# Patient Record
Sex: Female | Born: 1985 | Race: White | Hispanic: No | Marital: Single | Smoking: Current some day smoker
Health system: Southern US, Community
[De-identification: ages and names within clinical notes are randomized; demographics above are authoritative.]

## PROBLEM LIST (undated history)

## (undated) DIAGNOSIS — F1011 Alcohol abuse, in remission: Secondary | ICD-10-CM

## (undated) DIAGNOSIS — F988 Other specified behavioral and emotional disorders with onset usually occurring in childhood and adolescence: Secondary | ICD-10-CM

## (undated) DIAGNOSIS — F419 Anxiety disorder, unspecified: Secondary | ICD-10-CM

## (undated) DIAGNOSIS — F329 Major depressive disorder, single episode, unspecified: Secondary | ICD-10-CM

## (undated) DIAGNOSIS — F32A Depression, unspecified: Secondary | ICD-10-CM

---

## 2017-05-22 ENCOUNTER — Encounter: Payer: Self-pay | Admitting: Gynecology

## 2017-05-22 ENCOUNTER — Other Ambulatory Visit: Payer: Self-pay

## 2017-05-22 ENCOUNTER — Ambulatory Visit
Admission: EM | Admit: 2017-05-22 | Discharge: 2017-05-22 | Disposition: A | Discharge status: Home / Self Care | Payer: Self-pay | Attending: Family Medicine | Admitting: Family Medicine

## 2017-05-22 DIAGNOSIS — N898 Other specified noninflammatory disorders of vagina: Secondary | ICD-10-CM

## 2017-05-22 DIAGNOSIS — B9689 Other specified bacterial agents as the cause of diseases classified elsewhere: Secondary | ICD-10-CM

## 2017-05-22 DIAGNOSIS — J340 Abscess, furuncle and carbuncle of nose: Secondary | ICD-10-CM

## 2017-05-22 DIAGNOSIS — N760 Acute vaginitis: Secondary | ICD-10-CM

## 2017-05-22 HISTORY — DX: Alcohol abuse, in remission: F10.11

## 2017-05-22 HISTORY — DX: Other specified behavioral and emotional disorders with onset usually occurring in childhood and adolescence: F98.8

## 2017-05-22 HISTORY — DX: Anxiety disorder, unspecified: F41.9

## 2017-05-22 HISTORY — DX: Major depressive disorder, single episode, unspecified: F32.9

## 2017-05-22 HISTORY — DX: Depression, unspecified: F32.A

## 2017-05-22 LAB — WET PREP, GENITAL
Clue Cells Wet Prep HPF POC: NONE SEEN
Sperm: NONE SEEN
Trich, Wet Prep: NONE SEEN
WBC, Wet Prep HPF POC: NONE SEEN
Yeast Wet Prep HPF POC: NONE SEEN

## 2017-05-22 MED ORDER — METRONIDAZOLE 500 MG PO TABS: 500.0000 mg | ORAL_TABLET | Freq: Two times a day (BID) | ORAL | 0 refills | Status: DC

## 2017-05-22 MED ORDER — MUPIROCIN 2 % EX OINT: 1.0000 "application " | TOPICAL_OINTMENT | Freq: Three times a day (TID) | CUTANEOUS | 0 refills | Status: DC

## 2017-05-22 NOTE — ED Provider Notes (Signed)
[facility]    CSN: [id number] Arrival date & time: 05/22/17  [id number]     History   Chief Complaint Chief Complaint  Patient presents with  . Vaginitis    HPI Tara Pennington is a 31 y.o. female.   HPI  This is a 31 year old female presents with a 2 weeks history of a vaginal infection.  She is having a yellowish discharge has a fishy smell.  States that she has a new female partner suspects she may be the source.  In addition she has had a couple of previous infections requiring Flagyl has helped.  She has tried over-the-counter Monistat for 4 days but has had no success this episode.  She had no fever or chills.  States that she has no external irritation.         Past Medical History:  Diagnosis Date  . ADD (attention deficit disorder)   . Anxiety and depression   . History of alcohol abuse     There are no active problems to display for this patient.   History reviewed. No pertinent surgical history.  OB History    No data available       Home Medications    Prior to Admission medications   Medication Sig Start Date End Date Taking? Authorizing Provider  citalopram (CELEXA) 40 MG tablet Take 40 mg by mouth daily.   Yes [provider]  metroNIDAZOLE (FLAGYL) 500 MG tablet Take 1 tablet (500 mg total) by mouth 2 (two) times daily. 05/22/17   Lutricia Feiloemer, Fardeen Steinberger P, PA-C  mupirocin ointment (BACTROBAN) 2 % Apply 1 application topically 3 (three) times daily. 05/22/17   Lutricia Feiloemer, Joden Bonsall P, PA-C    Family History Family History  Problem Relation Age of Onset  . Heart disease Father     Social History Social History   Tobacco Use  . Smoking status: Current Some Day Smoker    Packs/day: 0.50    Types: Cigarettes  . Smokeless tobacco: Never Used  Substance Use Topics  . Alcohol use: Yes  . Drug use: No     Allergies   Patient has no known allergies.   Review of Systems Review of Systems  Constitutional: Positive for activity  change. Negative for chills, fatigue and fever.  Genitourinary: Positive for vaginal discharge. Negative for dysuria, frequency, pelvic pain, vaginal bleeding and vaginal pain.  All other systems reviewed and are negative.    Physical Exam Triage Vital Signs ED Triage Vitals [05/22/17 0916]  Enc Vitals Group     BP 109/66     Pulse Rate 60     Resp 16     Temp 98.2 F (36.8 C)     Temp Source Oral     SpO2 100 %     Weight 145 lb (65.8 kg)     Height 5\' 1"  (1.549 m)     Head Circumference      Peak Flow      Pain Score 2     Pain Loc      Pain Edu?      Excl. in [place]?    No data found.  Updated Vital Signs BP 109/66 (BP Location: Left Arm)   Pulse 60   Temp 98.2 F (36.8 C) (Oral)   Resp 16   Ht 5\' 1"  (1.549 m)   Wt 145 lb (65.8 kg)   LMP 05/21/2017   SpO2 100%   BMI 27.40 kg/m   Visual Acuity Right Eye Distance:  Left Eye Distance:   Bilateral Distance:    Right Eye Near:   Left Eye Near:    Bilateral Near:     Physical Exam  Constitutional: She is oriented to person, place, and time. She appears well-developed and well-nourished. No distress.  HENT:  Head: Normocephalic.  Patient had a large cold sore on the left sidewall of her nose and extends into the mucous membrane.  And now has a secondary infection.  Eyes: Pupils are equal, round, and reactive to light.  Neck: Normal range of motion.  Genitourinary:  Genitourinary Comments: Patient obtained a self swab.  No physical examination was performed  Musculoskeletal: Normal range of motion.  Neurological: She is alert and oriented to person, place, and time.  Skin: Skin is warm and dry. She is not diaphoretic.  Psychiatric: She has a normal mood and affect. Her behavior is normal. Judgment and thought content normal.  Nursing note and vitals reviewed.    UC Treatments / Results  Labs (all labs ordered are listed, but only abnormal results are displayed) Labs Reviewed  WET PREP, GENITAL     EKG  EKG Interpretation None       Radiology No results found.  Procedures Procedures (including critical care time)  Medications Ordered in UC Medications - No data to display   Initial Impression / Assessment and Plan / UC Course  I have reviewed the triage vital signs and the nursing notes.  Pertinent labs & imaging results that were available during my care of the patient were reviewed by me and considered in my medical decision making (see chart for details).     Plan: 1. Test/x-ray results and diagnosis reviewed with patient 2. rx as per orders; risks, benefits, potential side effects reviewed with patient 3. Recommend supportive treatment with use of Bactroban ointment to the cellulitis on the nose.  Place ointment 3 times daily.  No vaginal abnormal cells were seen on the self obtained specimen.  Because her symptoms and recurrence of her previous BV I will treat her empirically at this time.  I have told her that if she does not improve she will need to have further evaluation.  She has established with a primary care in [place] and I will direct her there if she continues to have symptoms. 4. F/u prn if symptoms worsen or don't improve   Final Clinical Impressions(s) / UC Diagnoses   Final diagnoses:  BV (bacterial vaginosis)  Cellulitis of sidewall of nose    ED Discharge Orders        Ordered    metroNIDAZOLE (FLAGYL) 500 MG tablet  2 times daily     05/22/17 1030    mupirocin ointment (BACTROBAN) 2 %  3 times daily     05/22/17 1030       Controlled Substance Prescriptions [place] Controlled Substance Registry consulted? Not Applicable   Lutricia FeilRoemer, Jancarlo Biermann P, PA-C 05/22/17 1037

## 2017-05-22 NOTE — ED Triage Notes (Signed)
Per patient vaginal infection x 2 weeks. Pt. C/o yellowish discharge.

## 2018-06-14 ENCOUNTER — Ambulatory Visit: Payer: BLUE CROSS/BLUE SHIELD | Admitting: Surgery

## 2020-12-16 ENCOUNTER — Ambulatory Visit (INDEPENDENT_AMBULATORY_CARE_PROVIDER_SITE_OTHER): Payer: BC Managed Care – PPO

## 2020-12-16 ENCOUNTER — Encounter: Payer: Self-pay | Admitting: Emergency Medicine

## 2020-12-16 ENCOUNTER — Ambulatory Visit: Payer: BC Managed Care – PPO

## 2020-12-16 ENCOUNTER — Other Ambulatory Visit: Payer: Self-pay

## 2020-12-16 ENCOUNTER — Ambulatory Visit
Admission: EM | Admit: 2020-12-16 | Discharge: 2020-12-16 | Disposition: A | Discharge status: Home / Self Care | Payer: BC Managed Care – PPO | Attending: Internal Medicine | Admitting: Internal Medicine

## 2020-12-16 DIAGNOSIS — U071 COVID-19: Secondary | ICD-10-CM | POA: Diagnosis not present

## 2020-12-16 DIAGNOSIS — G8929 Other chronic pain: Secondary | ICD-10-CM | POA: Insufficient documentation

## 2020-12-16 DIAGNOSIS — F17200 Nicotine dependence, unspecified, uncomplicated: Secondary | ICD-10-CM | POA: Diagnosis present

## 2020-12-16 DIAGNOSIS — R079 Chest pain, unspecified: Secondary | ICD-10-CM

## 2020-12-16 DIAGNOSIS — R059 Cough, unspecified: Secondary | ICD-10-CM | POA: Diagnosis not present

## 2020-12-16 DIAGNOSIS — M25552 Pain in left hip: Secondary | ICD-10-CM | POA: Diagnosis not present

## 2020-12-16 DIAGNOSIS — M25512 Pain in left shoulder: Secondary | ICD-10-CM | POA: Diagnosis not present

## 2020-12-16 LAB — SARS CORONAVIRUS 2 (TAT 6-24 HRS): SARS Coronavirus 2: POSITIVE — AB

## 2020-12-16 NOTE — ED Provider Notes (Signed)
[facility]    CSN: [id number] Arrival date & time: 12/16/20  1136      History   Chief Complaint Chief Complaint  Patient presents with   Sore Throat   Cough   Nasal Congestion   Chest Pain    Left side     HPI Tara Pennington is a 35 y.o. female who presents with onset of ST, mild non productive cough, body aches x 2 days. Denies fever or feeling bad, just feels she has a cold.  She tested herself for Covid yesterday and was positive and her employer needs her to be tested at a clinic. She also has been having L chest pain described as " sore" x 4 months off and on which is a little more painful and constant for the past 2 days. Denies sweats, but admits she is a smoker.     Past Medical History:  Diagnosis Date   ADD (attention deficit disorder)    Anxiety and depression    History of alcohol abuse     There are no problems to display for this patient.   History reviewed. No pertinent surgical history.  OB History   No obstetric history on file.      Home Medications    Prior to Admission medications   Medication Sig Start Date End Date Taking? Authorizing Provider  citalopram (CELEXA) 40 MG tablet Take 40 mg by mouth daily.    [provider]    Family History Family History  Problem Relation Age of Onset   Heart disease Father     Social History Social History   Tobacco Use   Smoking status: Some Days    Packs/day: 0.50    Types: Cigarettes   Smokeless tobacco: Never  Substance Use Topics   Alcohol use: Yes   Drug use: No     Allergies   Patient has no known allergies.   Review of Systems Review of Systems  Constitutional:  Negative for activity change, appetite change, chills, diaphoresis, fatigue and fever.  HENT:  Positive for congestion, postnasal drip, rhinorrhea and sore throat. Negative for trouble swallowing.   Eyes:  Negative for discharge.  Respiratory:  Positive for cough. Negative for chest tightness  and shortness of breath.   Cardiovascular:  Positive for chest pain.  Gastrointestinal:  Negative for diarrhea, nausea and vomiting.  Musculoskeletal:  Positive for myalgias.       L shoulder and L hip-pelvic pain x 1 year since a few days after she had the 2nd covid shot.   Skin:  Negative for rash.  Neurological:  Positive for headaches.  Hematological:  Negative for adenopathy.    Physical Exam Triage Vital Signs ED Triage Vitals  Enc Vitals Group     BP 12/16/20 1202 (!) 134/99     Pulse Rate 12/16/20 1202 79     Resp 12/16/20 1202 18     Temp 12/16/20 1202 98.4 F (36.9 C)     Temp Source 12/16/20 1202 Oral     SpO2 12/16/20 1202 100 %     Weight --      Height --      Head Circumference --      Peak Flow --      Pain Score 12/16/20 1159 7     Pain Loc --      Pain Edu? --      Excl. in [place]? --    No data found.  Updated Vital Signs  BP (!) 134/99   Pulse 79   Temp 98.4 F (36.9 C) (Oral)   Resp 18   LMP 11/25/2020 Comment: denies preg. signed waiver  SpO2 100%   Visual Acuity Right Eye Distance:   Left Eye Distance:   Bilateral Distance:    Right Eye Near:   Left Eye Near:    Bilateral Near:      Physical Exam Vitals signs and nursing note reviewed.  Constitutional:      General: She is not in acute distress.    Appearance: Normal appearance. She is not ill-appearing, toxic-appearing or diaphoretic.  HENT:     Head: Normocephalic.     Right Ear: Tympanic membrane, ear canal and external ear normal.     Left Ear: Tympanic membrane, ear canal and external ear normal.     Nose: Nose normal.     Mouth/Throat:     Mouth: Mucous membranes are moist.  Eyes:     General: No scleral icterus.       Right eye: No discharge.        Left eye: No discharge.     Conjunctiva/sclera: Conjunctivae normal.  Neck:     Musculoskeletal: Neck supple. No neck rigidity.  Cardiovascular:     Rate and Rhythm: Normal rate and regular rhythm.     Heart sounds: No  murmur.  Pulmonary:     Effort: Pulmonary effort is normal.     Breath sounds: Normal breath sounds. Does not have chest wall tenderness   Musculoskeletal: Normal range of motion if L shoulder and L up with no pain.   Lymphadenopathy:     Cervical: No cervical adenopathy.  Skin:    General: Skin is warm and dry.     Coloration: Skin is not jaundiced.     Findings: No rash.  Neurological:     Mental Status: She is alert and oriented to person, place, and time. Lower leg and L arm reflexes are normal.     Gait: Gait normal.  Psychiatric:        Mood and Affect: Mood normal.        Behavior: Behavior normal.        Thought Content: Thought content normal.        Judgment: Judgment normal.    UC Treatments / Results  Labs (all labs ordered are listed, but only abnormal results are displayed) Labs Reviewed  SARS CORONAVIRUS 2 (TAT 6-24 HRS)    EKG   Radiology DG Chest 2 View  Result Date: 12/16/2020 CLINICAL DATA:  Left-sided chest pain for 4 months. Cough. COVID-19 positive. EXAM: CHEST - 2 VIEW COMPARISON:  None. FINDINGS: The cardiomediastinal silhouette is within normal limits. The lungs are well inflated and clear. There is no evidence of pleural effusion or pneumothorax. No acute osseous abnormality is identified. IMPRESSION: No active cardiopulmonary disease. Electronically Signed   By: Sebastian Ache M.D.   On: 12/16/2020 13:14   DG Shoulder Left  Result Date: 12/16/2020 CLINICAL DATA:  Anterior left shoulder pain for 1 year. EXAM: LEFT SHOULDER - 2+ VIEW COMPARISON:  None. FINDINGS: There is no evidence of fracture or dislocation. There is no evidence of arthropathy or other focal bone abnormality. Soft tissues are unremarkable. IMPRESSION: Negative. Electronically Signed   By: Sebastian Ache M.D.   On: 12/16/2020 13:14   DG Hip Unilat With Pelvis 2-3 Views Left  Result Date: 12/16/2020 CLINICAL DATA:  Left hip pain. EXAM: DG HIP (WITH OR WITHOUT PELVIS)  2-3V LEFT  COMPARISON:  None. FINDINGS: No acute fracture or hip dislocation is identified. Left hip joint space width is preserved. A 2 mm well corticated ossicle at the superolateral aspect of the left acetabulum may be developmental or related to remote trauma. IMPRESSION: No acute osseous abnormality identified. Electronically Signed   By: Sebastian Ache M.D.   On: 12/16/2020 13:16    Procedures Procedures (including critical care time)  Medications Ordered in UC Medications - No data to display  Initial Impression / Assessment and Plan / UC Course  I have reviewed the triage vital signs and the nursing notes. Pertinent  imaging results that were available during my care of the patient were reviewed by me and considered in my medical decision making (see chart for details). Covid test is pending. Has viral URI, covid suspect. Chronic L chest pain, L shoulder pain and L hip-pelvic pain. Advised to FU with PCP with chronic pains.  She declined antiviral rx.  See instructions    Final Clinical Impressions(s) / UC Diagnoses   Final diagnoses:  COVID-19 virus infection  Chronic chest pain  Chronic left shoulder pain  Chronic hip pain, left  Smoker     Discharge Instructions      Your xrays look normal to me, but if the radiologist find something I did not see I will call you. Work on quit smoking. Please follow up with your primary care doctor about your chest  pain and L shoulder and hip pains You may take any cold medications to help your symptoms. You may take the following supplements to help support your immune system fight Covid infection since you are not interested in taking the experimental antiviral medication.  Take Quarcetin 500 mg three times a day x 7 days with Zinc 50 mg ones a day x 7 days. The quarcetin is an antiviral and anti-inflammatory supplement which helps open the zinc channels in the cell to absorb Zinc. Zinc helps decrease the virus load in your body. Take Melatonin  6-10 mg at bed time which also helps support your immune system.  Also make sure to take Vit D 5,000 IU per day with a fatty meal and Vit C 5000 mg a day until you are completely better. To prevent viral illnesses your vitamin D should be between 60-80. Stay on Vitamin D 2,000  and C  1000 mg the rest of the season.  Don't lay around, keep active and walk as much as you are able to to prevent worsening of your symptoms.  Follow up with your family Dr next week.  If you get short of breath and you are able to check  your oxygen with a pulse oxygen meter, if it gets to 92% or less, you need to go to the hospital to be admitted. If you dont have one, come back here and we will assess you.       ED Prescriptions   None    PDMP not reviewed this encounter.   Garey Ham, PA-C 12/16/20 1323

## 2020-12-16 NOTE — ED Triage Notes (Signed)
Pt is present today with sore throat, cough, sore throat, chest pain, and HA. Pt states that her sx started Saturday. Pt states that she tested positive yesterday for covid using the at home covid test. Pt states that she has had chest pain for over four months

## 2020-12-16 NOTE — Discharge Instructions (Addendum)
Your xrays look normal to me, but if the radiologist find something I did not see I will call you. Work on quit smoking. Please follow up with your primary care doctor about your chest  pain and L shoulder and hip pains You may take any cold medications to help your symptoms. You may take the following supplements to help support your immune system fight Covid infection since you are not interested in taking the experimental antiviral medication.  Take Quarcetin 500 mg three times a day x 7 days with Zinc 50 mg ones a day x 7 days. The quarcetin is an antiviral and anti-inflammatory supplement which helps open the zinc channels in the cell to absorb Zinc. Zinc helps decrease the virus load in your body. Take Melatonin 6-10 mg at bed time which also helps support your immune system.  Also make sure to take Vit D 5,000 IU per day with a fatty meal and Vit C 5000 mg a day until you are completely better. To prevent viral illnesses your vitamin D should be between 60-80. Stay on Vitamin D 2,000  and C  1000 mg the rest of the season.  Don't lay around, keep active and walk as much as you are able to to prevent worsening of your symptoms.  Follow up with your family Dr next week.  If you get short of breath and you are able to check  your oxygen with a pulse oxygen meter, if it gets to 92% or less, you need to go to the hospital to be admitted. If you dont have one, come back here and we will assess you.

## 2022-03-21 ENCOUNTER — Ambulatory Visit
Admission: EM | Admit: 2022-03-21 | Discharge: 2022-03-21 | Disposition: A | Discharge status: Home / Self Care | Payer: BC Managed Care – PPO

## 2022-03-21 DIAGNOSIS — S0992XA Unspecified injury of nose, initial encounter: Secondary | ICD-10-CM | POA: Diagnosis not present

## 2022-03-21 NOTE — Discharge Instructions (Addendum)
As we discussed there is nothing to do acutely for a broken nose.  Protect nose from further injury.  You can apply ice to the bridge of your nose for torments at a time 2-3 times a day to help with pain and swelling.  Avoid blowing your nose as you do not want to start bleeding again.  Use over-the-counter Tylenol or ibuprofen as needed for pain and swelling.  If once the swelling goes down your nose is misshapen you can consult an ear nose and throat physician to discuss having surgery to correct its appearance.

## 2022-03-21 NOTE — ED Triage Notes (Signed)
Pt states she fell off her porch last night, injured nose

## 2022-03-21 NOTE — ED Provider Notes (Signed)
[facility]    CSN: [id number] Arrival date & time: 03/21/22  1345      History   Chief Complaint Chief Complaint  Patient presents with   Fall   Facial Injury    HPI Tara Pennington is a 36 y.o. female.   HPI  36 year old female here for evaluation of nasal injury.  Patient reports that she fell off her porch last night and struck her nose on a set of concrete steps.  She states that it bled a lot but she was able to get it to stop.  She is not having any active bleeding now.  She denies any difficulty breathing through her nose.  She did not have a loss of consciousness.  Patient does have swelling and tenderness to the bridge of her nose.  Past Medical History:  Diagnosis Date   ADD (attention deficit disorder)    Anxiety and depression    History of alcohol abuse     There are no problems to display for this patient.   History reviewed. No pertinent surgical history.  OB History   No obstetric history on file.      Home Medications    Prior to Admission medications   Medication Sig Start Date End Date Taking? Authorizing Provider  citalopram (CELEXA) 40 MG tablet Take 40 mg by mouth daily.    [provider]  disulfiram (ANTABUSE) 250 MG tablet Take 250 mg by mouth daily. 02/24/22   [provider]    Family History Family History  Problem Relation Age of Onset   Heart disease Father     Social History Social History   Tobacco Use   Smoking status: Some Days    Packs/day: 0.50    Types: Cigarettes   Smokeless tobacco: Never  Substance Use Topics   Alcohol use: Yes   Drug use: No     Allergies   Patient has no known allergies.   Review of Systems Review of Systems  HENT:  Positive for facial swelling and nosebleeds.        Swelling to the bridge of her nose and had a nosebleed last night after suffering a fall.  No active bleeding now.  Neurological:  Negative for syncope.     Physical Exam Triage Vital  Signs ED Triage Vitals  Enc Vitals Group     BP 03/21/22 1354 110/69     Pulse Rate 03/21/22 1354 96     Resp --      Temp 03/21/22 1354 98.4 F (36.9 C)     Temp Source 03/21/22 1354 Oral     SpO2 03/21/22 1354 99 %     Weight 03/21/22 1353 160 lb (72.6 kg)     Height 03/21/22 1353 5\' 1"  (1.549 m)     Head Circumference --      Peak Flow --      Pain Score 03/21/22 1353 6     Pain Loc --      Pain Edu? --      Excl. in [place]? --    No data found.  Updated Vital Signs BP 110/69 (BP Location: Right Arm)   Pulse 96   Temp 98.4 F (36.9 C) (Oral)   Ht 5\' 1"  (1.549 m)   Wt 160 lb (72.6 kg)   LMP 03/14/2022 (Approximate)   SpO2 99%   BMI 30.23 kg/m   Visual Acuity Right Eye Distance:   Left Eye Distance:   Bilateral Distance:  Right Eye Near:   Left Eye Near:    Bilateral Near:     Physical Exam Vitals and nursing note reviewed.  Constitutional:      Appearance: Normal appearance. She is not ill-appearing.  HENT:     Head: Normocephalic.     Nose:     Comments: Patient has erythema and swelling to the bridge of the nose as well as tenderness.  No crepitus appreciated on exam.  On examination of the nares there is no evidence of septal hematoma.  There is also no dried blood or active bleeding present in either nare. Eyes:     Extraocular Movements: Extraocular movements intact.     Pupils: Pupils are equal, round, and reactive to light.  Skin:    General: Skin is warm and dry.     Capillary Refill: Capillary refill takes less than 2 seconds.     Findings: Erythema present. No bruising.  Neurological:     General: No focal deficit present.     Mental Status: She is alert and oriented to person, place, and time.  Psychiatric:        Mood and Affect: Mood normal.        Behavior: Behavior normal.        Thought Content: Thought content normal.        Judgment: Judgment normal.      UC Treatments / Results  Labs (all labs ordered are listed, but only  abnormal results are displayed) Labs Reviewed - No data to display  EKG   Radiology No results found.  Procedures Procedures (including critical care time)  Medications Ordered in UC Medications - No data to display  Initial Impression / Assessment and Plan / UC Course  I have reviewed the triage vital signs and the nursing notes.  Pertinent labs & imaging results that were available during my care of the patient were reviewed by me and considered in my medical decision making (see chart for details).   Patient is a pleasant, nontoxic-appearing 36 year old female here for evaluation of possible broken nose after suffering a fall last night off of her porch onto a concrete step.  There is erythema to the bridge of the nose as well as edema but no ecchymosis or abrasions noted.  She does have tenderness with palpation of the vomer but no tenderness to palpation of the glabella or the proximal nasal bridge.  Intranasal exam does not reveal the presence of any septal hematoma, dried blood, or active bleeding.  I have advised the patient that we can x-ray her nose to see if it is broken but in the acute phase there is nothing to do for it, especially given that she does not have any difficulty breathing out of either side of her nose.  I have advised her that once the swelling goes down if her nose appears crooked or she does not like its appearance she can consult ENT to discuss having corrective surgery but there is nothing to be done acutely other than protect her nose from further injury, apply ice for the swelling, and use over-the-counter Tylenol or NSAIDs as needed for pain.  Patient declines an x-ray and states that she will follow-up with ENT if her nose is crooked after the swelling goes down.   Final Clinical Impressions(s) / UC Diagnoses   Final diagnoses:  Nasal injury, initial encounter     Discharge Instructions      As we discussed there is nothing to do  acutely for a  broken nose.  Protect nose from further injury.  You can apply ice to the bridge of your nose for torments at a time 2-3 times a day to help with pain and swelling.  Avoid blowing your nose as you do not want to start bleeding again.  Use over-the-counter Tylenol or ibuprofen as needed for pain and swelling.  If once the swelling goes down your nose is misshapen you can consult an ear nose and throat physician to discuss having surgery to correct its appearance.     ED Prescriptions   None    PDMP not reviewed this encounter.   Margarette Canada, NP 03/21/22 1436

## 2022-10-13 ENCOUNTER — Other Ambulatory Visit: Payer: Self-pay | Admitting: Family

## 2022-10-13 DIAGNOSIS — R1032 Left lower quadrant pain: Secondary | ICD-10-CM

## 2022-10-13 DIAGNOSIS — K439 Ventral hernia without obstruction or gangrene: Secondary | ICD-10-CM

## 2023-03-24 ENCOUNTER — Ambulatory Visit
Admission: EM | Admit: 2023-03-24 | Discharge: 2023-03-24 | Disposition: A | Discharge status: Home / Self Care | Payer: 59 | Attending: Emergency Medicine | Admitting: Emergency Medicine

## 2023-03-24 ENCOUNTER — Encounter: Payer: Self-pay | Admitting: Emergency Medicine

## 2023-03-24 DIAGNOSIS — B3731 Acute candidiasis of vulva and vagina: Secondary | ICD-10-CM | POA: Diagnosis present

## 2023-03-24 DIAGNOSIS — K529 Noninfective gastroenteritis and colitis, unspecified: Secondary | ICD-10-CM | POA: Insufficient documentation

## 2023-03-24 DIAGNOSIS — R110 Nausea: Secondary | ICD-10-CM | POA: Diagnosis present

## 2023-03-24 DIAGNOSIS — E871 Hypo-osmolality and hyponatremia: Secondary | ICD-10-CM

## 2023-03-24 LAB — CBC WITH DIFFERENTIAL/PLATELET
Abs Immature Granulocytes: 0.01 10*3/uL (ref 0.00–0.07)
Basophils Absolute: 0.1 10*3/uL (ref 0.0–0.1)
Basophils Relative: 1 %
Eosinophils Absolute: 0.2 10*3/uL (ref 0.0–0.5)
Eosinophils Relative: 3 %
HCT: 39.6 % (ref 36.0–46.0)
Hemoglobin: 12.9 g/dL (ref 12.0–15.0)
Immature Granulocytes: 0 %
Lymphocytes Relative: 18 %
Lymphs Abs: 1.1 10*3/uL (ref 0.7–4.0)
MCH: 28.4 pg (ref 26.0–34.0)
MCHC: 32.6 g/dL (ref 30.0–36.0)
MCV: 87.2 fL (ref 80.0–100.0)
Monocytes Absolute: 0.7 10*3/uL (ref 0.1–1.0)
Monocytes Relative: 12 %
Neutro Abs: 4 10*3/uL (ref 1.7–7.7)
Neutrophils Relative %: 66 %
Platelets: 269 10*3/uL (ref 150–400)
RBC: 4.54 MIL/uL (ref 3.87–5.11)
RDW: 15.5 % (ref 11.5–15.5)
WBC: 6 10*3/uL (ref 4.0–10.5)
nRBC: 0 % (ref 0.0–0.2)

## 2023-03-24 LAB — COMPREHENSIVE METABOLIC PANEL
ALT: 16 U/L (ref 0–44)
AST: 23 U/L (ref 15–41)
Albumin: 4.8 g/dL (ref 3.5–5.0)
Alkaline Phosphatase: 57 U/L (ref 38–126)
Anion gap: 11 (ref 5–15)
BUN: 17 mg/dL (ref 6–20)
CO2: 20 mmol/L — ABNORMAL LOW (ref 22–32)
Calcium: 9.5 mg/dL (ref 8.9–10.3)
Chloride: 100 mmol/L (ref 98–111)
Creatinine, Ser: 0.65 mg/dL (ref 0.44–1.00)
GFR, Estimated: >60 mL/min (ref >60)
Glucose, Bld: 111 mg/dL — ABNORMAL HIGH (ref 70–99)
Potassium: 4 mmol/L (ref 3.5–5.1)
Sodium: 131 mmol/L — ABNORMAL LOW (ref 135–145)
Total Bilirubin: 0.8 mg/dL (ref 0.3–1.2)
Total Protein: 8.6 g/dL — ABNORMAL HIGH (ref 6.5–8.1)

## 2023-03-24 LAB — URINALYSIS, W/ REFLEX TO CULTURE (INFECTION SUSPECTED)
Bilirubin Urine: NEGATIVE
Glucose, UA: NEGATIVE mg/dL
Hgb urine dipstick: NEGATIVE
Ketones, ur: NEGATIVE mg/dL
Leukocytes,Ua: NEGATIVE
Nitrite: NEGATIVE
Protein, ur: NEGATIVE mg/dL
Specific Gravity, Urine: 1.025 (ref 1.005–1.030)
pH: 5.5 (ref 5.0–8.0)

## 2023-03-24 LAB — LIPASE, BLOOD: Lipase: 30 U/L (ref 11–51)

## 2023-03-24 IMAGING — CR DG CHEST 2V
2 series · 2 of 2 positions shown · non-contrast
Comparison: None.

CLINICAL DATA: Left-sided chest pain for 4 months. Cough. [id number]
positive.

EXAM:
CHEST - 2 VIEW

[chest pa]
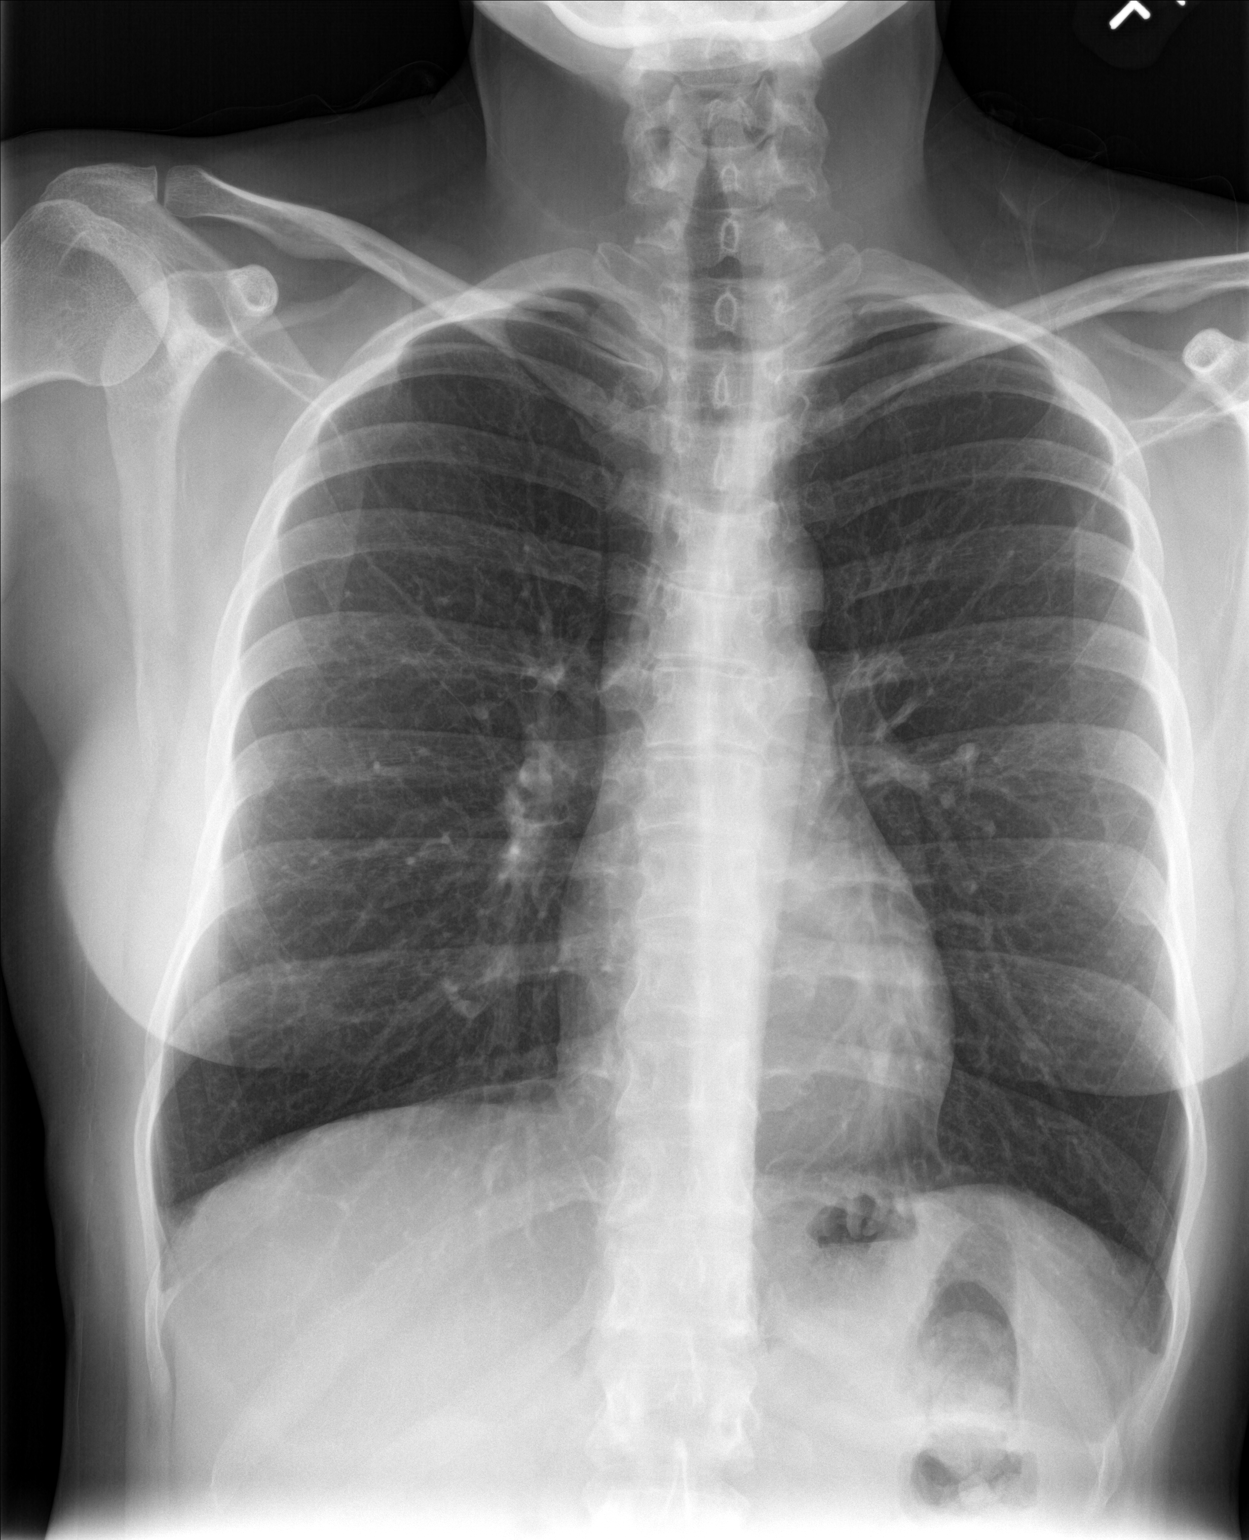

[chest lat]
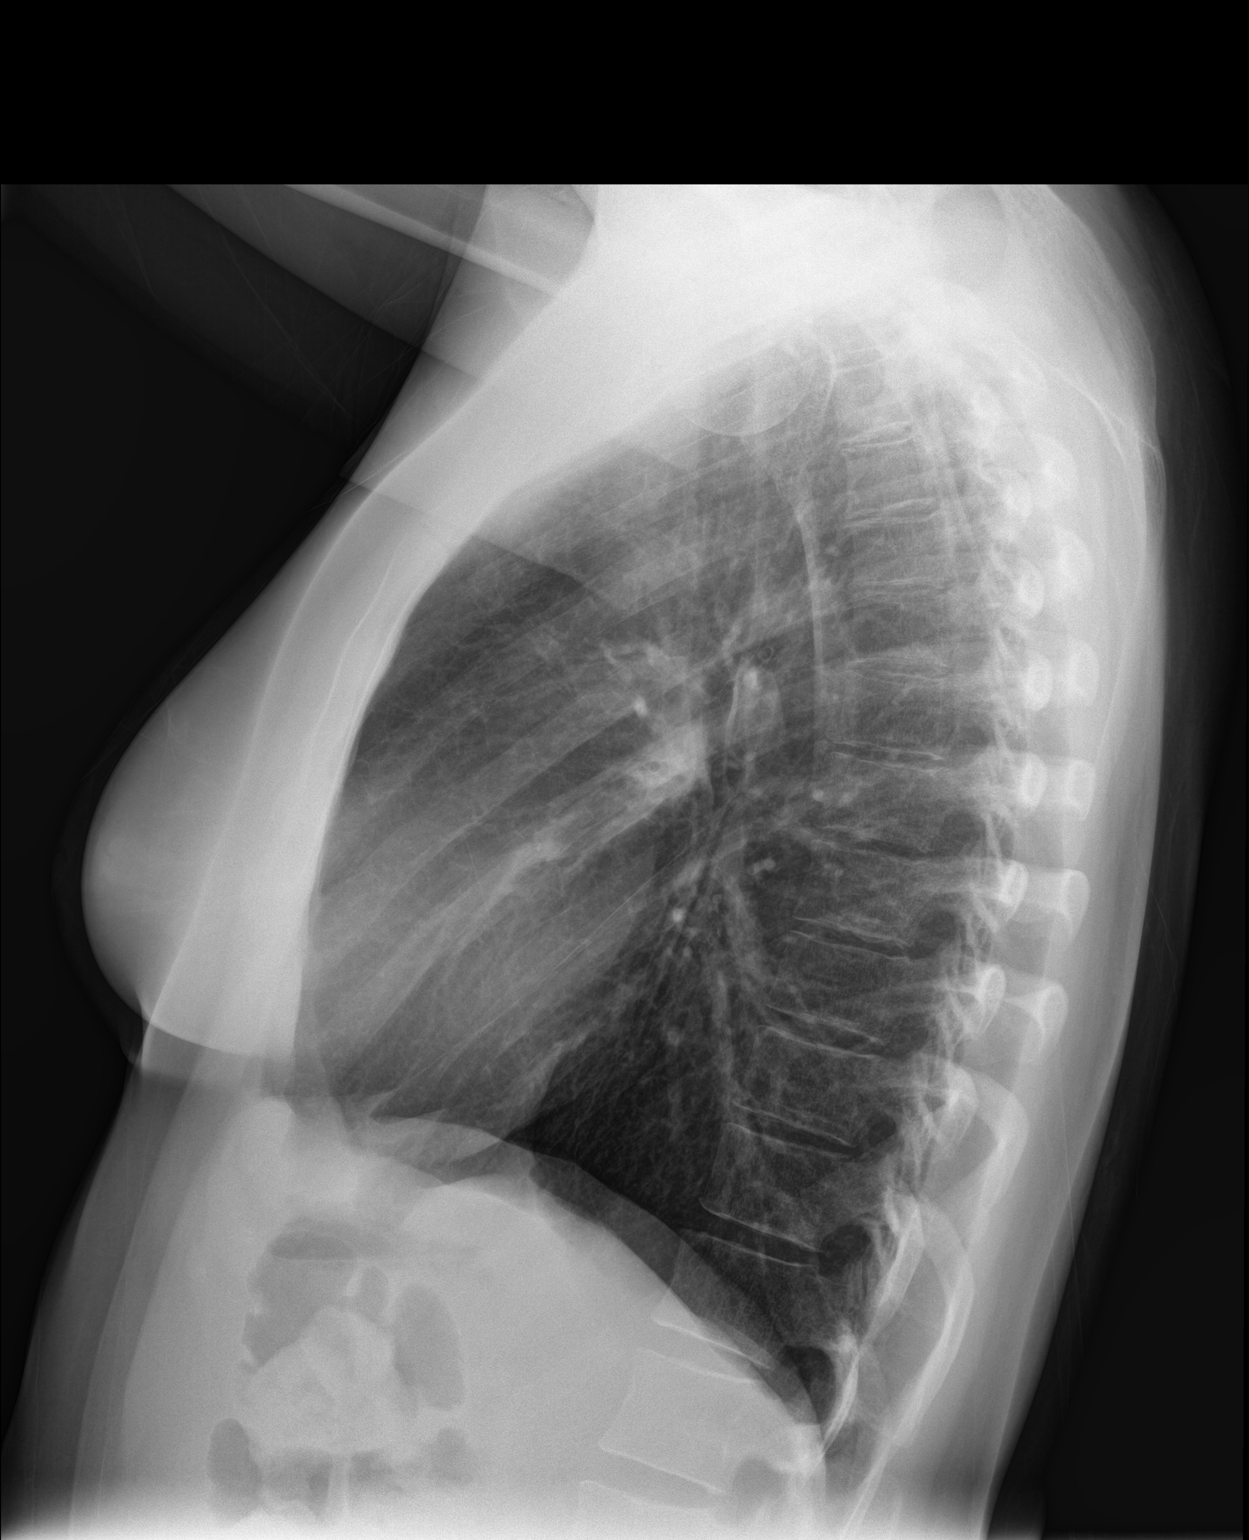

[2 of 2 positions shown; findings below may reference images not displayed]

FINDINGS: The cardiomediastinal silhouette is within normal limits. The lungs
are well inflated and clear. There is no evidence of pleural
effusion or pneumothorax. No acute osseous abnormality is
identified.
IMPRESSION: No active cardiopulmonary disease.

## 2023-03-24 MED ORDER — ONDANSETRON 8 MG PO TBDP: 8.0000 mg | ORAL_TABLET | Freq: Three times a day (TID) | ORAL | 0 refills | Status: AC | PRN

## 2023-03-24 MED ORDER — DICYCLOMINE HCL 20 MG PO TABS: 20.0000 mg | ORAL_TABLET | Freq: Two times a day (BID) | ORAL | 0 refills | Status: AC

## 2023-03-24 MED ORDER — FLUCONAZOLE 150 MG PO TABS: 150.0000 mg | ORAL_TABLET | ORAL | 0 refills | Status: AC

## 2023-03-24 NOTE — Discharge Instructions (Addendum)
Take the Zofran every 8 hours as needed for nausea and vomiting.  They are an oral disintegrating tablet and you can place them on her under your tongue and then will be absorbed.  Use the Bentyl (dicyclomine) every 6 hours as needed for abdominal cramping.  Follow a clear liquid diet for the next 6 to 12 hours.  Clear liquids consist of broth, ginger ale, water, Pedialyte, and Jell-O.  After 6 to 12 hours, if you are tolerating clear liquids, you can advance to bland foods such as bananas, rice, applesauce, and toast.  If you tolerate bland foods you can continue to advance your diet as you see fit.  If you develop a fever over 100.5, increased abdominal pain, bloody vomit, or bloody stool return for reevaluation or go to the ER.   Your urinalysis did show budding yeast, which is most likely coming from your vaginal vault so I will treat you for a vaginal yeast infection.  Please take the Diflucan 150 mg tablets.  You will take 1 tablet today and repeat dosing every 3 days for total of 3 doses.

## 2023-03-24 NOTE — ED Triage Notes (Signed)
Pt c/o diarrhea, abdominal pain and nausea x 3 days.

## 2023-03-24 NOTE — ED Provider Notes (Signed)
[facility]    CSN: [id number] Arrival date & time: 03/24/23  0803      History   Chief Complaint Chief Complaint  Patient presents with   Diarrhea   Nausea   Abdominal Pain    HPI Tara Pennington is a 37 y.o. female.   HPI  37 year old female with a past medical history significant for alcohol abuse, anxiety depression, and ADD presents for evaluation of 3 days worth of crampy abdominal pain with associated nausea and diarrhea.  She reports that she is having 6+ watery stools a day but denies any blood in her stool.  She also denies fever or vomiting, sick contacts with similar symptoms, or recent travel.  She states that she has been eating raw vegetables and had a salad kit.  She also started using a new salad dressing and she is the only one who has used it but states that it does not taste bad.  She is no longer taking Antabuse and she is drinking daily.  She endorses up to a 12 pack daily of beer on the weekends and was noncommittal during the week.  Past Medical History:  Diagnosis Date   ADD (attention deficit disorder)    Anxiety and depression    History of alcohol abuse     There are no problems to display for this patient.   History reviewed. No pertinent surgical history.  OB History   No obstetric history on file.      Home Medications    Prior to Admission medications   Medication Sig Start Date End Date Taking? Authorizing Provider  citalopram (CELEXA) 40 MG tablet Take 40 mg by mouth daily.   Yes [provider]  dicyclomine (BENTYL) 20 MG tablet Take 1 tablet (20 mg total) by mouth 2 (two) times daily. 03/24/23  Yes Becky Augusta, NP  fluconazole (DIFLUCAN) 150 MG tablet Take 1 tablet (150 mg total) by mouth every 3 (three) days for 3 doses. 03/24/23 03/31/23 Yes Becky Augusta, NP  ondansetron (ZOFRAN-ODT) 8 MG disintegrating tablet Take 1 tablet (8 mg total) by mouth every 8 (eight) hours as needed for nausea or vomiting. 03/24/23   Yes Becky Augusta, NP  topiramate (TOPAMAX) 25 MG tablet Take by mouth. 03/09/23 03/08/24 Yes [provider]    Family History Family History  Problem Relation Age of Onset   Heart disease Father     Social History Social History   Tobacco Use   Smoking status: Some Days    Current packs/day: 0.50    Types: Cigarettes   Smokeless tobacco: Never  Vaping Use   Vaping status: Never Used  Substance Use Topics   Alcohol use: Yes   Drug use: No     Allergies   Patient has no known allergies.   Review of Systems Review of Systems  Constitutional:  Negative for fever.  Gastrointestinal:  Positive for abdominal pain, diarrhea and nausea. Negative for blood in stool and vomiting.  Neurological:  Positive for dizziness.     Physical Exam Triage Vital Signs ED Triage Vitals  Encounter Vitals Group     BP 03/24/23 0823 (!) 139/91     Systolic BP Percentile --      Diastolic BP Percentile --      Pulse Rate 03/24/23 0823 62     Resp 03/24/23 0823 18     Temp 03/24/23 0823 97.6 F (36.4 C)     Temp Source 03/24/23 0823 Oral  SpO2 03/24/23 0823 99 %     Weight --      Height --      Head Circumference --      Peak Flow --      Pain Score 03/24/23 0821 3     Pain Loc --      Pain Education --      Exclude from Growth Chart --    No data found.  Updated Vital Signs BP (!) 139/91 (BP Location: Right Arm)   Pulse 62   Temp 97.6 F (36.4 C) (Oral)   Resp 18   LMP  (LMP Unknown)   SpO2 99%   Visual Acuity Right Eye Distance:   Left Eye Distance:   Bilateral Distance:    Right Eye Near:   Left Eye Near:    Bilateral Near:     Physical Exam Vitals and nursing note reviewed.  Constitutional:      Appearance: Normal appearance. She is not ill-appearing.  HENT:     Head: Normocephalic and atraumatic.  Cardiovascular:     Rate and Rhythm: Normal rate and regular rhythm.     Pulses: Normal pulses.     Heart sounds: Normal heart sounds. No murmur  heard.    No friction rub. No gallop.  Pulmonary:     Effort: Pulmonary effort is normal.     Breath sounds: Normal breath sounds. No wheezing, rhonchi or rales.  Abdominal:     General: Abdomen is flat.     Palpations: Abdomen is soft.     Tenderness: There is abdominal tenderness. There is no guarding or rebound.     Comments: Tenderness in the left upper quadrant and left lower quadrant.  Mild tenderness in the right lower quadrant below McBurney's point.  Skin:    General: Skin is warm and dry.     Capillary Refill: Capillary refill takes less than 2 seconds.  Neurological:     General: No focal deficit present.     Mental Status: She is alert and oriented to person, place, and time.      UC Treatments / Results  Labs (all labs ordered are listed, but only abnormal results are displayed) Labs Reviewed  COMPREHENSIVE METABOLIC PANEL - Abnormal; Notable for the following components:      Result Value   Sodium 131 (*)    CO2 20 (*)    Glucose, Bld 111 (*)    Total Protein 8.6 (*)    All other components within normal limits  URINALYSIS, W/ REFLEX TO CULTURE (INFECTION SUSPECTED) - Abnormal; Notable for the following components:   Bacteria, UA FEW (*)    All other components within normal limits  CBC WITH DIFFERENTIAL/PLATELET  LIPASE, BLOOD    EKG   Radiology No results found.  Procedures Procedures (including critical care time)  Medications Ordered in UC Medications - No data to display  Initial Impression / Assessment and Plan / UC Course  I have reviewed the triage vital signs and the nursing notes.  Pertinent labs & imaging results that were available during my care of the patient were reviewed by me and considered in my medical decision making (see chart for details).   Patient is a nontoxic-appearing 37 year old female presenting for evaluation of crampy abdominal pain with associated nausea and diarrhea as indicated above.  She reports that she has been  drinking water at home as well as able to eat solid food.  She does endorse nausea at present but  denied antiemetics when offered.  Her physical exam does reveal abdominal tenderness in the left upper quadrant, left lower quadrant, and right lower quadrant.  No guarding or rebound.  The tenderness in the right lower quadrant below McBurney's point.  She is drinking alcohol daily and is no longer taking Antabuse.  Differentials include pancreatitis, diverticulitis, food poisoning, and viral GI illness.  I will check a CBC, CMP, lipase, and UA.  CBC shows normal white count of 6 as well as normal H&H of 12.9 and 39.6, normal platelets of 269.  CMP shows mild hyponatremia with a sodium of 131 but potassium and chloride are normal.  CO2 is mildly decreased at 20 and glucose is elevated at 111.  Renal function and transaminases are unremarkable.  Lipase is normal at 30.  Urinalysis is unremarkable but patient does have budding yeast present on her UA micro.  I will discharge patient home with a diagnosis of viral gastroenteritis and started her on Zofran and 8 mg every 8 hours as needed for nausea along with dicyclomine 20 mg every 6 hours as needed for abdominal cramping.  She should continue to orally rehydrate and I will recommend a bland diet at present.  If her symptoms not improve, or they worsen, she can return for reevaluation or seek care in the ER.  I will also treat her developing yeast infection with Diflucan 150 mg tablets, 1 tablet now with repeat dosing every 3 days x 3 doses.   Final Clinical Impressions(s) / UC Diagnoses   Final diagnoses:  Noninfectious gastroenteritis, unspecified type  Vaginal yeast infection     Discharge Instructions      Take the Zofran every 8 hours as needed for nausea and vomiting.  They are an oral disintegrating tablet and you can place them on her under your tongue and then will be absorbed.  Use the Bentyl (dicyclomine) every 6 hours as needed for  abdominal cramping.  Follow a clear liquid diet for the next 6 to 12 hours.  Clear liquids consist of broth, ginger ale, water, Pedialyte, and Jell-O.  After 6 to 12 hours, if you are tolerating clear liquids, you can advance to bland foods such as bananas, rice, applesauce, and toast.  If you tolerate bland foods you can continue to advance your diet as you see fit.  If you develop a fever over 100.5, increased abdominal pain, bloody vomit, or bloody stool return for reevaluation or go to the ER.   Your urinalysis did show budding yeast, which is most likely coming from your vaginal vault so I will treat you for a vaginal yeast infection.  Please take the Diflucan 150 mg tablets.  You will take 1 tablet today and repeat dosing every 3 days for total of 3 doses.     ED Prescriptions     Medication Sig Dispense Auth. Provider   ondansetron (ZOFRAN-ODT) 8 MG disintegrating tablet Take 1 tablet (8 mg total) by mouth every 8 (eight) hours as needed for nausea or vomiting. 20 tablet Becky Augusta, NP   dicyclomine (BENTYL) 20 MG tablet Take 1 tablet (20 mg total) by mouth 2 (two) times daily. 20 tablet Becky Augusta, NP   fluconazole (DIFLUCAN) 150 MG tablet Take 1 tablet (150 mg total) by mouth every 3 (three) days for 3 doses. 3 tablet Becky Augusta, NP      PDMP not reviewed this encounter.   Becky Augusta, NP 03/24/23 [phone]

## 2023-09-15 ENCOUNTER — Ambulatory Visit
Admission: RE | Admit: 2023-09-15 | Discharge: 2023-09-15 | Disposition: A | Discharge status: Home / Self Care | Source: Ambulatory Visit | Attending: Family | Admitting: Family

## 2023-09-15 DIAGNOSIS — K439 Ventral hernia without obstruction or gangrene: Secondary | ICD-10-CM | POA: Insufficient documentation

## 2023-09-15 DIAGNOSIS — R1032 Left lower quadrant pain: Secondary | ICD-10-CM | POA: Diagnosis present

## 2023-12-09 ENCOUNTER — Other Ambulatory Visit: Payer: Self-pay | Admitting: Obstetrics and Gynecology

## 2023-12-09 DIAGNOSIS — D259 Leiomyoma of uterus, unspecified: Secondary | ICD-10-CM

## 2023-12-17 ENCOUNTER — Ambulatory Visit

## 2023-12-21 ENCOUNTER — Ambulatory Visit
Admission: RE | Admit: 2023-12-21 | Discharge: 2023-12-21 | Disposition: A | Discharge status: Home / Self Care | Source: Ambulatory Visit | Attending: Obstetrics and Gynecology | Admitting: Obstetrics and Gynecology

## 2023-12-21 DIAGNOSIS — D259 Leiomyoma of uterus, unspecified: Secondary | ICD-10-CM | POA: Diagnosis present

## 2023-12-21 MED ORDER — GADOBUTROL 1 MMOL/ML IV SOLN
7.5000 mL | Freq: Once | INTRAVENOUS | Status: AC | PRN
  Administered 2023-12-21: 7.5 mL via INTRAVENOUS
